# Patient Record
Sex: Male | Born: 1975 | Race: Asian | Hispanic: No | Marital: Married | Smoking: Never smoker
Health system: Southern US, Community
[De-identification: ages and names within clinical notes are randomized; demographics above are authoritative.]

---

## 2016-06-17 ENCOUNTER — Other Ambulatory Visit: Payer: Self-pay | Admitting: Infectious Disease

## 2016-06-17 ENCOUNTER — Ambulatory Visit
Admission: RE | Admit: 2016-06-17 | Discharge: 2016-06-17 | Disposition: A | Discharge status: Home / Self Care | Payer: No Typology Code available for payment source | Source: Ambulatory Visit | Attending: Infectious Disease | Admitting: Infectious Disease

## 2016-06-17 DIAGNOSIS — Z111 Encounter for screening for respiratory tuberculosis: Secondary | ICD-10-CM

## 2018-09-08 ENCOUNTER — Other Ambulatory Visit: Payer: Self-pay

## 2018-09-08 ENCOUNTER — Encounter (HOSPITAL_COMMUNITY): Payer: Self-pay | Admitting: Emergency Medicine

## 2018-09-08 ENCOUNTER — Emergency Department (HOSPITAL_COMMUNITY)
Admission: EM | Admit: 2018-09-08 | Discharge: 2018-09-09 | Disposition: A | Discharge status: Home / Self Care | Payer: Self-pay | Attending: Emergency Medicine | Admitting: Emergency Medicine

## 2018-09-08 DIAGNOSIS — S301XXA Contusion of abdominal wall, initial encounter: Secondary | ICD-10-CM | POA: Insufficient documentation

## 2018-09-08 DIAGNOSIS — Y929 Unspecified place or not applicable: Secondary | ICD-10-CM | POA: Insufficient documentation

## 2018-09-08 DIAGNOSIS — Y939 Activity, unspecified: Secondary | ICD-10-CM | POA: Insufficient documentation

## 2018-09-08 DIAGNOSIS — S20219A Contusion of unspecified front wall of thorax, initial encounter: Secondary | ICD-10-CM | POA: Insufficient documentation

## 2018-09-08 DIAGNOSIS — N130 Hydronephrosis with ureteropelvic junction obstruction: Secondary | ICD-10-CM | POA: Insufficient documentation

## 2018-09-08 DIAGNOSIS — Y999 Unspecified external cause status: Secondary | ICD-10-CM | POA: Insufficient documentation

## 2018-09-08 DIAGNOSIS — N200 Calculus of kidney: Secondary | ICD-10-CM | POA: Insufficient documentation
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
## 2018-09-09 ENCOUNTER — Emergency Department (HOSPITAL_COMMUNITY): Payer: Self-pay

## 2018-09-09 ENCOUNTER — Encounter (HOSPITAL_COMMUNITY): Payer: Self-pay | Admitting: Radiology

## 2018-09-09 LAB — I-STAT CREATININE, ED: Creatinine, Ser: 1.1 mg/dL (ref 0.61–1.24)

## 2018-09-09 MED ORDER — IOHEXOL 300 MG/ML  SOLN
100.0000 mL | Freq: Once | INTRAMUSCULAR | Status: AC | PRN
  Administered 2018-09-09: 100 mL via INTRAVENOUS

## 2018-09-09 MED ORDER — IBUPROFEN 600 MG PO TABS: 600.0000 mg | ORAL_TABLET | Freq: Four times a day (QID) | ORAL | 0 refills | Status: DC | PRN

## 2018-09-09 MED ORDER — IBUPROFEN 400 MG PO TABS
600.0000 mg | ORAL_TABLET | Freq: Once | ORAL | Status: AC
  Administered 2018-09-09: 600 mg via ORAL
  Filled 2018-09-09: qty 1

## 2018-09-09 NOTE — ED Notes (Signed)
Patient verbalizes understanding of discharge instructions. Opportunity for questioning and answers were provided. Armband removed by staff, pt discharged from ED ambulatory.   
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
## 2018-09-09 NOTE — ED Notes (Signed)
Patient transported to CT 

## 2020-04-06 ENCOUNTER — Ambulatory Visit (HOSPITAL_COMMUNITY)
Admission: EM | Admit: 2020-04-06 | Discharge: 2020-04-06 | Disposition: A | Discharge status: Home / Self Care | Payer: 59 | Attending: Family Medicine | Admitting: Family Medicine

## 2020-04-06 ENCOUNTER — Encounter (HOSPITAL_COMMUNITY): Payer: Self-pay

## 2020-04-06 ENCOUNTER — Other Ambulatory Visit: Payer: Self-pay | Admitting: Family Medicine

## 2020-04-06 ENCOUNTER — Other Ambulatory Visit: Payer: Self-pay

## 2020-04-06 DIAGNOSIS — M2550 Pain in unspecified joint: Secondary | ICD-10-CM

## 2020-04-06 DIAGNOSIS — R062 Wheezing: Secondary | ICD-10-CM | POA: Diagnosis not present

## 2020-04-06 DIAGNOSIS — R030 Elevated blood-pressure reading, without diagnosis of hypertension: Secondary | ICD-10-CM

## 2020-04-06 MED ORDER — ALBUTEROL SULFATE HFA 108 (90 BASE) MCG/ACT IN AERS: 1.0000 | INHALATION_SPRAY | Freq: Four times a day (QID) | RESPIRATORY_TRACT | 0 refills | Status: DC | PRN

## 2020-04-06 MED ORDER — PREDNISONE 10 MG PO TABS: ORAL_TABLET | ORAL | 0 refills | Status: DC

## 2020-04-06 NOTE — ED Provider Notes (Signed)
[facility]    CSN: [id number] Arrival date & time: 04/06/20  1026      History   Chief Complaint Chief Complaint  Patient presents with  . Muscle Pain    HPI Ronald Maxwell is a 44 y.o. male.   Patient declines medical interpreter today. Presenting for flare up of joint pains which he states seems to happen about once a year with the weather turning cold. He states each year he get put on prednisone taper which gets things back under control and otherwise just takes ibuprofen as needed for it. Denies joint redness, swelling, new injury. Also notes episodic wheezing also with the fall weather change. Does not have any inhalers at home, denies any pulmonary diagnoses. Denies CP, SOB, fever, chills. No sick contacts or recent illnesses.      History reviewed. No pertinent past medical history.  There are no problems to display for this patient.   History reviewed. No pertinent surgical history.     Home Medications    Prior to Admission medications   Medication Sig Start Date End Date Taking? Authorizing Provider  albuterol (VENTOLIN HFA) 108 (90 Base) MCG/ACT inhaler Inhale 1-2 puffs into the lungs every 6 (six) hours as needed for wheezing or shortness of breath. 04/06/20   Particia Nearing, PA-C  ibuprofen (ADVIL) 600 MG tablet Take 1 tablet (600 mg total) by mouth every 6 (six) hours as needed. 09/09/18   Elpidio Anis, PA-C  predniSONE (DELTASONE) 10 MG tablet Take 6 tabs day one, 5 tabs day two, 4 tabs day three, etc 04/06/20   Particia Nearing, PA-C    Family History No family history on file.  Social History Social History   Tobacco Use  . Smoking status: Never Smoker  . Smokeless tobacco: Never Used  Substance Use Topics  . Alcohol use: Yes  . Drug use: Never     Allergies   Patient has no known allergies.   Review of Systems Review of Systems PER HPI    Physical Exam Triage Vital Signs ED Triage Vitals  Enc Vitals  Group     BP 04/06/20 1103 (!) 144/103     Pulse Rate 04/06/20 1100 81     Resp 04/06/20 1100 18     Temp 04/06/20 1100 98 F (36.7 C)     Temp Source 04/06/20 1103 Oral     SpO2 04/06/20 1100 99 %     Weight 04/06/20 1104 165 lb 5.5 oz (75 kg)     Height --      Head Circumference --      Peak Flow --      Pain Score 04/06/20 1102 4     Pain Loc --      Pain Edu? --      Excl. in GC? --    No data found.  Updated Vital Signs BP (!) 144/103 (BP Location: Right Arm)   Pulse 83   Temp 98.1 F (36.7 C) (Oral)   Resp 18   Wt 165 lb 5.5 oz (75 kg)   SpO2 100%   BMI 27.51 kg/m   Visual Acuity Right Eye Distance:   Left Eye Distance:   Bilateral Distance:    Right Eye Near:   Left Eye Near:    Bilateral Near:     Physical Exam Vitals and nursing note reviewed.  Constitutional:      Appearance: Normal appearance.  HENT:     Head: Atraumatic.  Right Ear: Tympanic membrane normal.     Left Ear: Tympanic membrane normal.     Nose: Nose normal.     Mouth/Throat:     Mouth: Mucous membranes are moist.     Pharynx: Oropharynx is clear. No posterior oropharyngeal erythema.  Eyes:     Extraocular Movements: Extraocular movements intact.     Conjunctiva/sclera: Conjunctivae normal.  Cardiovascular:     Rate and Rhythm: Normal rate and regular rhythm.  Pulmonary:     Effort: Pulmonary effort is normal.     Breath sounds: Wheezing (diffuse, moderate) present.  Musculoskeletal:        General: No swelling, tenderness, deformity or signs of injury. Normal range of motion.     Cervical back: Normal range of motion and neck supple.  Skin:    General: Skin is warm and dry.     Findings: No bruising or erythema.  Neurological:     General: No focal deficit present.     Mental Status: He is oriented to person, place, and time.  Psychiatric:        Mood and Affect: Mood normal.        Thought Content: Thought content normal.        Judgment: Judgment normal.      UC  Treatments / Results  Labs (all labs ordered are listed, but only abnormal results are displayed) Labs Reviewed - No data to display  EKG   Radiology No results found.  Procedures Procedures (including critical care time)  Medications Ordered in UC Medications - No data to display  Initial Impression / Assessment and Plan / UC Course  I have reviewed the triage vital signs and the nursing notes.  Pertinent labs & imaging results that were available during my care of the patient were reviewed by me and considered in my medical decision making (see chart for details).     Will tx with prednisone taper, albuterol inhaler and ibuprofen for prn use for joint pains. Discussed need for PCP to follow up and manage preventative and chronic conditions. DASH diet, exercise for BP control. Return precautions given. Vital signs reassuring today aside from mildly elevated BP.   Final Clinical Impressions(s) / UC Diagnoses   Final diagnoses:  Arthralgia, unspecified joint  Wheezing  Elevated blood pressure reading   Discharge Instructions   None    ED Prescriptions    Medication Sig Dispense Auth. Provider   predniSONE (DELTASONE) 10 MG tablet Take 6 tabs day one, 5 tabs day two, 4 tabs day three, etc 21 tablet Particia Nearing, PA-C   albuterol (VENTOLIN HFA) 108 (90 Base) MCG/ACT inhaler Inhale 1-2 puffs into the lungs every 6 (six) hours as needed for wheezing or shortness of breath. 18 g Particia Nearing, [place]     PDMP not reviewed this encounter.   Particia Nearing, [place] 04/06/20 1156

## 2020-04-06 NOTE — ED Triage Notes (Signed)
Pt states he is out of his meds for his muscle pain.

## 2020-04-25 ENCOUNTER — Ambulatory Visit (HOSPITAL_COMMUNITY)
Admission: EM | Admit: 2020-04-25 | Discharge: 2020-04-25 | Disposition: A | Discharge status: Home / Self Care | Payer: 59 | Attending: Family Medicine | Admitting: Family Medicine

## 2020-04-25 ENCOUNTER — Other Ambulatory Visit: Payer: Self-pay

## 2020-04-25 ENCOUNTER — Encounter (HOSPITAL_COMMUNITY): Payer: Self-pay

## 2020-04-25 ENCOUNTER — Ambulatory Visit (HOSPITAL_COMMUNITY)
Admission: EM | Admit: 2020-04-25 | Discharge: 2020-04-25 | Disposition: A | Discharge status: Home / Self Care | Payer: 59

## 2020-04-25 DIAGNOSIS — R030 Elevated blood-pressure reading, without diagnosis of hypertension: Secondary | ICD-10-CM

## 2020-04-25 DIAGNOSIS — M2550 Pain in unspecified joint: Secondary | ICD-10-CM

## 2020-04-25 MED ORDER — PREDNISONE 10 MG (48) PO TBPK: ORAL_TABLET | ORAL | 0 refills | Status: DC

## 2020-04-25 NOTE — Discharge Instructions (Addendum)
Your blood pressure was noted to be elevated during your visit today. If you are currently taking medication for high blood pressure, please ensure you are taking this as directed. If you do not have a history of high blood pressure and your blood pressure remains persistently elevated, you may need to begin taking a medication at some point. You may return here within the next few days to recheck if unable to see your primary care provider or if do not have a one.  BP (!) 157/107 (BP Location: Right Arm)   Pulse 87   Temp 97.9 F (36.6 C) (Oral)   Resp 18   SpO2 99%

## 2020-04-25 NOTE — ED Triage Notes (Signed)
Pt reports needing a refill no Prednisone. Pt states he is still in pain.

## 2020-04-26 NOTE — ED Provider Notes (Signed)
[facility]   [id number] 04/25/20 Arrival Time: 1556  ASSESSMENT & PLAN:  1. Arthralgia of multiple joints   2. Elevated blood pressure reading without diagnosis of hypertension     Discussed that he will need to find a PCP to discuss long-term steroid use for joint problems.  Meds ordered this encounter  Medications   predniSONE (STERAPRED UNI-PAK 48 TAB) 10 MG (48) TBPK tablet    Sig: Take as directed.    Dispense:  48 tablet    Refill:  0     Discharge Instructions     Your blood pressure was noted to be elevated during your visit today. If you are currently taking medication for high blood pressure, please ensure you are taking this as directed. If you do not have a history of high blood pressure and your blood pressure remains persistently elevated, you may need to begin taking a medication at some point. You may return here within the next few days to recheck if unable to see your primary care provider or if do not have a one.  BP (!) 157/107 (BP Location: Right Arm)    Pulse 87    Temp 97.9 F (36.6 C) (Oral)    Resp 18    SpO2 99%        Follow-up Information    Schedule an appointment as soon as possible for a visit  with [facility].   Contact information: [address] [place] [address] [phone]       Schedule an appointment as soon as possible for a visit  with [facility].   Contact information: [address] [place] [address] [phone]              Reviewed expectations re: course of current medical issues. Questions answered. Outlined signs and symptoms indicating need for more acute intervention. Understanding verbalized. After Visit Summary given.   SUBJECTIVE: History from: patient. Ronald Maxwell is a 44 y.o. male who presents with "wintertime joint pains"; has been on prednisone for 3 months during the winter in the [place]. Otherwise feeling well. Describes generalized joint aches daily.  Increased blood pressure noted today. Reports that he has not been treated for hypertension in the past. He reports no chest pain on exertion, no dyspnea on exertion, no swelling of ankles, no orthostatic dizziness or lightheadedness, no orthopnea or paroxysmal nocturnal dyspnea and no palpitations.   OBJECTIVE:  Vitals:   04/25/20 1803  BP: (!) 157/107  Pulse: 87  Resp: 18  Temp: 97.9 F (36.6 C)  TempSrc: Oral  SpO2: 99%    General appearance: alert; no distress Eyes: PERRLA; EOMI; conjunctiva normal Neck: supple  Lungs: speaks full sentences without difficulty; unlabored Extremities: no edema; moves all extremities normally Skin: warm and dry Neurologic: normal gait Psychological: alert and cooperative; normal mood and affect   No Known Allergies  History reviewed. No pertinent past medical history. Social History   Socioeconomic History   Marital status: Married    Spouse name: Not on file   Number of children: Not on file   Years of education: Not on file   Highest education level: Not on file  Occupational History   Not on file  Tobacco Use   Smoking status: Never Smoker   Smokeless tobacco: Never Used  Substance and Sexual Activity   Alcohol use: Yes   Drug use: Never  Sexual activity: Not on file  Other Topics Concern   Not on file  Social History Narrative   Not on file   Social Determinants of Health   Financial Resource Strain: Not on file  Food Insecurity: Not on file  Transportation Needs: Not on file  Physical Activity: Not on file  Stress: Not on file  Social Connections: Not on file  Intimate Partner Violence: Not on file   History reviewed. No pertinent family history. History reviewed. No pertinent surgical history.   Mardella Layman, MD 04/26/20 [phone]

## 2020-04-28 ENCOUNTER — Other Ambulatory Visit: Payer: Self-pay | Admitting: Family Medicine

## 2020-04-30 NOTE — Telephone Encounter (Signed)
Requested Prescriptions  Pending Prescriptions Disp Refills  . albuterol (VENTOLIN HFA) 108 (90 Base) MCG/ACT inhaler [Pharmacy Med Name: ALBUTEROL HFA INH(200 PUFFS)18GM] 54 g 0    Sig: INHALE 1 TO 2 PUFFS INTO THE LUNGS EVERY 6 HOURS AS NEEDED FOR WHEEZING OR SHORTNESS OF BREATH     Pulmonology:  Beta Agonists Failed - 04/28/2020  8:47 AM      Failed - One inhaler should last at least one month. If the patient is requesting refills earlier, contact the patient to check for uncontrolled symptoms.      Failed - Valid encounter within last 12 months    Recent Outpatient Visits   None

## 2020-07-16 NOTE — Progress Notes (Deleted)
   Primary Physician/Referring:  Lindaann Pascal, PA-C  Patient ID: Ronald Maxwell, male    DOB: 19-Sep-1975, 45 y.o.   MRN: [id number]  No chief complaint on file.  HPI:    Ronald Maxwell  is a 45 y.o. ***  No past medical history on file. No past surgical history on file. No family history on file.  Social History   Tobacco Use  . Smoking status: Never Smoker  . Smokeless tobacco: Never Used  Substance Use Topics  . Alcohol use: Yes   Marital Status: Married  ROS  ***ROS Objective  There were no vitals taken for this visit.  Vitals with BMI 04/25/2020 04/06/2020 04/06/2020  Height - - -  Weight - 165 lbs 6 oz -  BMI - - -  Systolic 157 - 144  Diastolic 426 - 103  Pulse 87 - 83     ***Physical Exam Laboratory examination:   No results for input(s): NA, K, CL, CO2, GLUCOSE, BUN, CREATININE, CALCIUM, GFRNONAA, GFRAA in the last 8760 hours. CrCl cannot be calculated (Patient's most recent lab result is older than the maximum 21 days allowed.).  CMP Latest Ref Rng & Units 09/09/2018  Creatinine 0.61 - 1.24 mg/dL 8.34   No flowsheet data found.  Lipid Panel No results for input(s): CHOL, TRIG, LDLCALC, VLDL, HDL, CHOLHDL, LDLDIRECT in the last 8760 hours. Lipid Panel  No results found for: CHOL, TRIG, HDL, CHOLHDL, VLDL, LDLCALC, LDLDIRECT, LABVLDL   HEMOGLOBIN A1C No results found for: HGBA1C, MPG TSH No results for input(s): TSH in the last 8760 hours.  External labs:   Labs 06/23/2020:  Total cholesterol 141, glycerides 88, HDL 51, LDL 73.  Hepatitis AB and C panel negative.  Serum glucose 63 mg, BUN 13, creatinine 0.88, magnesium 2.1, ALT elevated at 90 (9-46), AST 41 (10-40).  Sedimentation rate and ANA normal.  Rheumatoid factor negative at <14.  C-reactive protein markedly elevated at 37.1.  TSH normal.  Total CPK 145.  Hb 16.8/HCT 51.3, WBC 8.4, platelets 196, normal indicis.  Medications and allergies  No Known Allergies   Outpatient  Medications Prior to Visit  Medication Sig  . albuterol (VENTOLIN HFA) 108 (90 Base) MCG/ACT inhaler INHALE 1 TO 2 PUFFS INTO THE LUNGS EVERY 6 HOURS AS NEEDED FOR WHEEZING OR SHORTNESS OF BREATH  . ibuprofen (ADVIL) 600 MG tablet Take 1 tablet (600 mg total) by mouth every 6 (six) hours as needed.  . predniSONE (STERAPRED UNI-PAK 48 TAB) 10 MG (48) TBPK tablet Take as directed.   No facility-administered medications prior to visit.    Radiology:   No results found.   Cardiac Studies:   *** EKG:     ***  ***  Assessment  No diagnosis found.   There are no discontinued medications.  No orders of the defined types were placed in this encounter.  No orders of the defined types were placed in this encounter.   Recommendations:   Ronald Maxwell is a 45 y.o. ***    Yates Decamp, MD, [facility] 07/16/2020, 10:01 PM Office: [phone]

## 2020-07-17 ENCOUNTER — Ambulatory Visit: Payer: 59 | Admitting: Cardiology

## 2020-07-17 DIAGNOSIS — I10 Essential (primary) hypertension: Secondary | ICD-10-CM

## 2020-07-17 DIAGNOSIS — R945 Abnormal results of liver function studies: Secondary | ICD-10-CM

## 2020-07-17 DIAGNOSIS — I480 Paroxysmal atrial fibrillation: Secondary | ICD-10-CM

## 2020-09-04 ENCOUNTER — Encounter: Payer: Self-pay | Admitting: Cardiology

## 2020-10-11 ENCOUNTER — Ambulatory Visit (HOSPITAL_COMMUNITY)
Admission: EM | Admit: 2020-10-11 | Discharge: 2020-10-11 | Disposition: A | Discharge status: Home / Self Care | Payer: 59 | Attending: Urgent Care | Admitting: Urgent Care

## 2020-10-11 ENCOUNTER — Encounter (HOSPITAL_COMMUNITY): Payer: Self-pay | Admitting: Emergency Medicine

## 2020-10-11 ENCOUNTER — Other Ambulatory Visit: Payer: Self-pay

## 2020-10-11 DIAGNOSIS — M79622 Pain in left upper arm: Secondary | ICD-10-CM

## 2020-10-11 DIAGNOSIS — M25512 Pain in left shoulder: Secondary | ICD-10-CM

## 2020-10-11 DIAGNOSIS — M25511 Pain in right shoulder: Secondary | ICD-10-CM

## 2020-10-11 DIAGNOSIS — M791 Myalgia, unspecified site: Secondary | ICD-10-CM | POA: Diagnosis not present

## 2020-10-11 DIAGNOSIS — M79621 Pain in right upper arm: Secondary | ICD-10-CM

## 2020-10-11 MED ORDER — TIZANIDINE HCL 4 MG PO TABS: 4.0000 mg | ORAL_TABLET | Freq: Three times a day (TID) | ORAL | 0 refills | Status: DC | PRN

## 2020-10-11 MED ORDER — NAPROXEN 500 MG PO TABS: 500.0000 mg | ORAL_TABLET | Freq: Two times a day (BID) | ORAL | 0 refills | Status: DC

## 2020-10-11 NOTE — ED Triage Notes (Signed)
Pt is present with joint and muscle pain. Pt states that his pain is primarily in his arms and joints. Pt states that his pain started few days ago

## 2020-10-11 NOTE — ED Provider Notes (Signed)
[facility]   MRN: [id number] DOB: December 10, 1975  Subjective:   Ronald Maxwell is a 45 y.o. male presenting for several day history of acute onset recurrent upper arm pain, upper back pain.  Patient states that he would like to get another prescription for steroids.  Reports that he has gotten these intermittently for muscle aches since he was in high school.  States that he has had multiple work-ups done and everything has been negative.  Patient is used to having 1-2 courses of steroids every year to feel better from his aches.  Denies any falls, trauma, weakness, numbness or tingling, history of musculoskeletal disorders, rheumatoid.  No swelling, erythema, fevers, nausea, vomiting.  Patient has not been particularly active in the past week.  He does not have a PCP.  No current facility-administered medications for this encounter.  Current Outpatient Medications:    albuterol (VENTOLIN HFA) 108 (90 Base) MCG/ACT inhaler, INHALE 1 TO 2 PUFFS INTO THE LUNGS EVERY 6 HOURS AS NEEDED FOR WHEEZING OR SHORTNESS OF BREATH, Disp: 54 g, Rfl: 0   ibuprofen (ADVIL) 600 MG tablet, Take 1 tablet (600 mg total) by mouth every 6 (six) hours as needed., Disp: 30 tablet, Rfl: 0   predniSONE (STERAPRED UNI-PAK 48 TAB) 10 MG (48) TBPK tablet, Take as directed., Disp: 48 tablet, Rfl: 0   No Known Allergies  History reviewed. No pertinent past medical history.   History reviewed. No pertinent surgical history.  History reviewed. No pertinent family history.  Social History   Tobacco Use   Smoking status: Never   Smokeless tobacco: Never  Substance Use Topics   Alcohol use: Yes   Drug use: Never    ROS   Objective:   Vitals: BP (!) 148/96   Pulse 99   Temp 98.5 F (36.9 C)   Resp 18   SpO2 100%   Physical Exam Constitutional:      General: He is not in acute distress.    Appearance: Normal appearance. He is well-developed and normal weight. He is not ill-appearing,  toxic-appearing or diaphoretic.  HENT:     Head: Normocephalic and atraumatic.     Right Ear: External ear normal.     Left Ear: External ear normal.     Nose: Nose normal.     Mouth/Throat:     Pharynx: Oropharynx is clear.  Eyes:     General: No scleral icterus.       Right eye: No discharge.        Left eye: No discharge.     Extraocular Movements: Extraocular movements intact.     Pupils: Pupils are equal, round, and reactive to light.  Cardiovascular:     Rate and Rhythm: Normal rate.  Pulmonary:     Effort: Pulmonary effort is normal.  Musculoskeletal:     Cervical back: Normal range of motion.     Comments: Full range of motion throughout.  Strength 5/5 for upper and lower extremities.  No swelling, erythema, warmth.  Patient endorses tenderness across his upper extremities and trapezius muscle, mild in nature.  Neurological:     Mental Status: He is alert and oriented to person, place, and time.  Psychiatric:        Mood and Affect: Mood normal.        Behavior: Behavior normal.        Thought Content: Thought content normal.        Judgment: Judgment normal.  Assessment and Plan :   PDMP not reviewed this encounter.  1. Myalgia   2. Pain of both shoulder joints   3. Pain in both upper arms     Patient requested a repeat steroid course but I refused.  Counseled against using steroids without a medically appropriate reason.  Discussed the risks of long-term use of steroids.  Recommended naproxen, tizanidine.  Patient refused work-up. Counseled patient on potential for adverse effects with medications prescribed/recommended today, ER and return-to-clinic precautions discussed, patient verbalized understanding.    Wallis Bamberg, PA-C 10/11/20 1435

## 2020-10-16 NOTE — Progress Notes (Deleted)
Date:  10/16/2020   ID:  Unk Pinto, DOB 1975-11-21, MRN [id number]  PCP:  Shanon Rosser, PA-C  Cardiologist:  Rex Kras, DO, [facility]  (established care 10/17/2020) Former Cardiology Providers: ***  REASON FOR CONSULT: Atrial fibrillation  REQUESTING PHYSICIAN:  Shanon Rosser, PA-C [address],  [place] [address]  No chief complaint on file.   HPI  Ronald Maxwell is a 45 y.o. male who presents to the office with a chief complaint of "***." Patient's past medical history and cardiovascular risk factors include: ***  He is referred to the office at the request of [place], PA-C for evaluation of atrial fibrillation.   History of  Denies prior history of coronary artery disease, myocardial infarction, congestive heart failure, deep venous thrombosis, pulmonary embolism, stroke, transient ischemic attack.  FUNCTIONAL STATUS:   ALLERGIES: No Known Allergies  MEDICATION LIST PRIOR TO VISIT: No outpatient medications have been marked as taking for the 10/17/20 encounter (Appointment) with Rex Kras, DO.     PAST MEDICAL HISTORY: No past medical history on file.  PAST SURGICAL HISTORY: No past surgical history on file.  FAMILY HISTORY: The patient family history is not on file.  SOCIAL HISTORY:  The patient  reports that he has never smoked. He has never used smokeless tobacco. He reports current alcohol use. He reports that he does not use drugs.  REVIEW OF SYSTEMS: ROS  PHYSICAL EXAM: Vitals with BMI 10/11/2020 10/11/2020 04/25/2020  Height - - -  Weight - - -  BMI - - -  Systolic 681 157 262  Diastolic 96 035 597  Pulse - 99 87    CONSTITUTIONAL: Well-developed and well-nourished. No acute distress.  SKIN: Skin is warm and dry. No rash noted. No cyanosis. No pallor. No jaundice HEAD: Normocephalic and atraumatic.  EYES: No scleral icterus MOUTH/THROAT: Moist oral membranes.  NECK: No JVD present. No thyromegaly noted. No carotid  bruits  LYMPHATIC: No visible cervical adenopathy.  CHEST Normal respiratory effort. No intercostal retractions  LUNGS: *** No stridor. No wheezes. No rales.  CARDIOVASCULAR: *** ABDOMINAL: No apparent ascites.  EXTREMITIES: No peripheral edema  HEMATOLOGIC: No significant bruising NEUROLOGIC: Oriented to person, place, and time. Nonfocal. Normal muscle tone.  PSYCHIATRIC: Normal mood and affect. Normal behavior. Cooperative  CARDIAC DATABASE: EKG: 09/26/2020:  Echocardiogram: No results found for this or any previous visit from the past 1095 days.    Stress Testing: No results found for this or any previous visit from the past 1095 days.   Heart Catheterization:   LABORATORY DATA: No flowsheet data found.  CMP Latest Ref Rng & Units 09/09/2018  Creatinine 0.61 - 1.24 mg/dL 1.10    Lipid Panel  No results found for: CHOL, TRIG, HDL, CHOLHDL, VLDL, LDLCALC, LDLDIRECT, LABVLDL  No components found for: NTPROBNP No results for input(s): PROBNP in the last 8760 hours. No results for input(s): TSH in the last 8760 hours.  BMP No results for input(s): NA, K, CL, CO2, GLUCOSE, BUN, CREATININE, CALCIUM, GFRNONAA, GFRAA in the last 8760 hours.  HEMOGLOBIN A1C No results found for: HGBA1C, MPG  External Labs:  Date Collected: 06/27/2020, information obtained by *** Potassium: 4.2 Creatinine 0.88 mg/dL. eGFR: 103 mL/min per 1.73 m Lipid profile: Total cholesterol 141 , triglycerides 88 , HDL 51 , LDL 73 AST: 41 , ALT: 90 , alkaline phosphatase: 93  Hemoglobin A1c: ***  Date Collected: 06/12/2020 Hemoglobin: 16.8 g/dL and hematocrit: 51.3 %  Date Collected: 06/23/2020  TSH: 1.57    IMPRESSION:  No diagnosis found.   RECOMMENDATIONS: Ronald Maxwell is a 45 y.o. male whose past medical history and cardiac risk factors include: ***   FINAL MEDICATION LIST END OF ENCOUNTER: No orders of the defined types were placed in this encounter.   There are no  discontinued medications.   Current Outpatient Medications:    albuterol (VENTOLIN HFA) 108 (90 Base) MCG/ACT inhaler, INHALE 1 TO 2 PUFFS INTO THE LUNGS EVERY 6 HOURS AS NEEDED FOR WHEEZING OR SHORTNESS OF BREATH, Disp: 54 g, Rfl: 0   ibuprofen (ADVIL) 600 MG tablet, Take 1 tablet (600 mg total) by mouth every 6 (six) hours as needed., Disp: 30 tablet, Rfl: 0   naproxen (NAPROSYN) 500 MG tablet, Take 1 tablet (500 mg total) by mouth 2 (two) times daily with a meal., Disp: 30 tablet, Rfl: 0   predniSONE (STERAPRED UNI-PAK 48 TAB) 10 MG (48) TBPK tablet, Take as directed., Disp: 48 tablet, Rfl: 0   tiZANidine (ZANAFLEX) 4 MG tablet, Take 1 tablet (4 mg total) by mouth every 8 (eight) hours as needed., Disp: 30 tablet, Rfl: 0  No orders of the defined types were placed in this encounter.   There are no Patient Instructions on file for this visit.   --Continue cardiac medications as reconciled in final medication list. --No follow-ups on file. Or sooner if needed. --Continue follow-up with your primary care physician regarding the management of your other chronic comorbid conditions.  Patient's questions and concerns were addressed to his satisfaction. He voices understanding of the instructions provided during this encounter.   This note was created using a voice recognition software as a result there may be grammatical errors inadvertently enclosed that do not reflect the nature of this encounter. Every attempt is made to correct such errors.  Rex Kras, [place], [facility]  Pager: [phone] Office: [phone]

## 2020-10-17 ENCOUNTER — Ambulatory Visit: Payer: 59 | Admitting: Cardiology

## 2020-11-01 ENCOUNTER — Other Ambulatory Visit: Payer: Self-pay

## 2020-11-01 ENCOUNTER — Encounter (HOSPITAL_COMMUNITY): Payer: Self-pay | Admitting: *Deleted

## 2020-11-01 ENCOUNTER — Ambulatory Visit (HOSPITAL_COMMUNITY)
Admission: EM | Admit: 2020-11-01 | Discharge: 2020-11-01 | Disposition: A | Discharge status: Home / Self Care | Payer: 59 | Attending: Student | Admitting: Student

## 2020-11-01 DIAGNOSIS — I4819 Other persistent atrial fibrillation: Secondary | ICD-10-CM | POA: Diagnosis not present

## 2020-11-01 DIAGNOSIS — R0602 Shortness of breath: Secondary | ICD-10-CM

## 2020-11-01 NOTE — ED Triage Notes (Signed)
PT reports SX's started 2 days ago . RT/LT arm pain with SHOB and CP.

## 2020-11-01 NOTE — ED Notes (Signed)
Provider given EKG results

## 2020-11-01 NOTE — ED Triage Notes (Signed)
PT denies CP and refuses EKG. PA notified

## 2020-11-01 NOTE — Discharge Instructions (Addendum)
-  Please head straight to the emergency department for further evaluation and management of your shortness of breath and left arm pain.  In the context of atrial fibrillation, this is a very concerning symptom.  A. fib puts you at risk of blood clots, and other cardiac issues like a heart attack, which can be deadly.  Please head straight to the emergency department, if your symptoms get worse on the way, stop and call [phone].

## 2020-11-01 NOTE — ED Notes (Signed)
Patient is being discharged from the Urgent Care and sent to the Emergency Department via personal car . Per Amedeo Plenty, patient is in need of higher level of care due to condition. Patient is aware and verbalizes understanding of plan of care.  Vitals:   11/01/20 1654 11/01/20 1657  BP: (!) 149/107   Pulse: 78   Resp: 18   Temp: 98.4 F (36.9 C)   SpO2: 98% 98%

## 2020-11-01 NOTE — ED Provider Notes (Addendum)
[facility]    CSN: [id number] Arrival date & time: 11/01/20  1604      History   Chief Complaint Chief Complaint  Patient presents with   Shortness of Breath   arm pain LT/RT    HPI Ronald Maxwell is a 45 y.o. male presenting with shortness of breath and left arm pain.  This patient has a history of atrial fibrillation but is not anticoagulated.  He initially endorses chest pain, but denies this upon further questioning.  States he is here for prednisone, which is helped in the past.  He denies history of pulmonary disease however.  Denies recent URI, cough, congestion.  Denies dizziness, weakness.  HPI  History reviewed. No pertinent past medical history.  There are no problems to display for this patient.   History reviewed. No pertinent surgical history.     Home Medications    Prior to Admission medications   Medication Sig Start Date End Date Taking? Authorizing Provider  albuterol (VENTOLIN HFA) 108 (90 Base) MCG/ACT inhaler INHALE 1 TO 2 PUFFS INTO THE LUNGS EVERY 6 HOURS AS NEEDED FOR WHEEZING OR SHORTNESS OF BREATH 04/30/20   Margaretann Loveless, PA-C  ibuprofen (ADVIL) 600 MG tablet Take 1 tablet (600 mg total) by mouth every 6 (six) hours as needed. 09/09/18   Elpidio Anis, PA-C  naproxen (NAPROSYN) 500 MG tablet Take 1 tablet (500 mg total) by mouth 2 (two) times daily with a meal. 10/11/20   Wallis Bamberg, PA-C  predniSONE (STERAPRED UNI-PAK 48 TAB) 10 MG (48) TBPK tablet Take as directed. 04/25/20   Mardella Layman, MD  tiZANidine (ZANAFLEX) 4 MG tablet Take 1 tablet (4 mg total) by mouth every 8 (eight) hours as needed. 10/11/20   Wallis Bamberg, PA-C    Family History History reviewed. No pertinent family history.  Social History Social History   Tobacco Use   Smoking status: Never   Smokeless tobacco: Never  Substance Use Topics   Alcohol use: Yes   Drug use: Never     Allergies   Patient has no known allergies.   Review of  Systems Review of Systems  Respiratory:  Positive for shortness of breath.   Cardiovascular:  Positive for chest pain.  All other systems reviewed and are negative.   Physical Exam Triage Vital Signs ED Triage Vitals  Enc Vitals Group     BP 11/01/20 1654 (!) 149/107     Pulse Rate 11/01/20 1654 78     Resp 11/01/20 1654 18     Temp 11/01/20 1654 98.4 F (36.9 C)     Temp src --      SpO2 11/01/20 1654 98 %     Weight --      Height --      Head Circumference --      Peak Flow --      Pain Score 11/01/20 1657 4     Pain Loc --      Pain Edu? --      Excl. in GC? --    No data found.  Updated Vital Signs BP (!) 149/107   Pulse 78   Temp 98.4 F (36.9 C)   Resp 18   SpO2 98%   Visual Acuity Right Eye Distance:   Left Eye Distance:   Bilateral Distance:    Right Eye Near:   Left Eye Near:    Bilateral Near:     Physical Exam Vitals reviewed.  Constitutional:  Appearance: Normal appearance. He is not diaphoretic.  HENT:     Head: Normocephalic and atraumatic.     Mouth/Throat:     Mouth: Mucous membranes are moist.  Eyes:     Extraocular Movements: Extraocular movements intact.     Pupils: Pupils are equal, round, and reactive to light.  Cardiovascular:     Rate and Rhythm: Normal rate.     Pulses:          Radial pulses are 2+ on the right side and 2+ on the left side.     Heart sounds: Normal heart sounds.     Comments: Radial pulses irregularly regular Pulmonary:     Effort: Pulmonary effort is normal.     Breath sounds: Wheezing present.     Comments: Expiratory wheezes throughout Abdominal:     Palpations: Abdomen is soft.     Tenderness: There is no abdominal tenderness. There is no guarding or rebound.  Musculoskeletal:     Right lower leg: No edema.     Left lower leg: No edema.  Skin:    General: Skin is warm.     Capillary Refill: Capillary refill takes less than 2 seconds.  Neurological:     General: No focal deficit present.      Mental Status: He is alert and oriented to person, place, and time.  Psychiatric:        Mood and Affect: Mood normal.        Behavior: Behavior normal.        Thought Content: Thought content normal.        Judgment: Judgment normal.     UC Treatments / Results  Labs (all labs ordered are listed, but only abnormal results are displayed) Labs Reviewed - No data to display  EKG   Radiology No results found.  Procedures Procedures (including critical care time)  Medications Ordered in UC Medications - No data to display  Initial Impression / Assessment and Plan / UC Course  I have reviewed the triage vital signs and the nursing notes.  Pertinent labs & imaging results that were available during my care of the patient were reviewed by me and considered in my medical decision making (see chart for details).     This patient is a very pleasant 45 y.o. year old male presenting with shortness of breath and left arm pain.  This patient has history of atrial fibrillation and is indeed in atrial fibrillation again today.  He is not anticoagulated.  Regular rate.  He does have expiratory wheezes throughout, but given his cardiac history I cannot discharge him home with prednisone without further cardiac work-up.  I recommend this patient head to the emergency department, he declines transport via EMS.  He verbalizes understanding  Coding this visit a Level 5 for - 1 acute illness that poses a threat to bodily function, ordering and interpretation of ekg, and decision to send to ED  Final Clinical Impressions(s) / UC Diagnoses   Final diagnoses:  Persistent atrial fibrillation (HCC)  Shortness of breath     Discharge Instructions      -Please head straight to the emergency department for further evaluation and management of your shortness of breath and left arm pain.  In the context of atrial fibrillation, this is a very concerning symptom.  A. fib puts you at risk of blood  clots, and other cardiac issues like a heart attack, which can be deadly.  Please head straight to the emergency  department, if your symptoms get worse on the way, stop and call 911.   ED Prescriptions   None    PDMP not reviewed this encounter.   Rhys Martini, PA-C 11/01/20 1841    Rhys Martini, PA-C 11/05/20 [phone]
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
## 2023-05-28 ENCOUNTER — Ambulatory Visit (INDEPENDENT_AMBULATORY_CARE_PROVIDER_SITE_OTHER): Payer: Medicaid Other

## 2023-05-28 ENCOUNTER — Encounter (HOSPITAL_COMMUNITY): Payer: Self-pay

## 2023-05-28 ENCOUNTER — Ambulatory Visit (HOSPITAL_COMMUNITY)
Admission: EM | Admit: 2023-05-28 | Discharge: 2023-05-28 | Disposition: A | Discharge status: Home / Self Care | Payer: Medicaid Other | Attending: Emergency Medicine | Admitting: Emergency Medicine

## 2023-05-28 ENCOUNTER — Other Ambulatory Visit: Payer: Self-pay

## 2023-05-28 DIAGNOSIS — R062 Wheezing: Secondary | ICD-10-CM | POA: Diagnosis not present

## 2023-05-28 DIAGNOSIS — R0602 Shortness of breath: Secondary | ICD-10-CM

## 2023-05-28 DIAGNOSIS — I4891 Unspecified atrial fibrillation: Secondary | ICD-10-CM | POA: Diagnosis not present

## 2023-05-28 MED ORDER — DEXAMETHASONE SODIUM PHOSPHATE 10 MG/ML IJ SOLN
INTRAMUSCULAR | Status: AC
  Filled 2023-05-28: qty 1

## 2023-05-28 MED ORDER — IPRATROPIUM-ALBUTEROL 0.5-2.5 (3) MG/3ML IN SOLN
RESPIRATORY_TRACT | Status: AC
  Filled 2023-05-28: qty 3

## 2023-05-28 MED ORDER — IPRATROPIUM-ALBUTEROL 0.5-2.5 (3) MG/3ML IN SOLN
3.0000 mL | Freq: Once | RESPIRATORY_TRACT | Status: AC
  Administered 2023-05-28: 3 mL via RESPIRATORY_TRACT

## 2023-05-28 MED ORDER — DEXAMETHASONE SODIUM PHOSPHATE 10 MG/ML IJ SOLN
10.0000 mg | Freq: Once | INTRAMUSCULAR | Status: AC
  Administered 2023-05-28: 10 mg via INTRAMUSCULAR

## 2023-05-28 MED ORDER — PREDNISONE 20 MG PO TABS: 40.0000 mg | ORAL_TABLET | Freq: Every day | ORAL | 0 refills | Status: AC

## 2023-05-28 MED ORDER — ALBUTEROL SULFATE HFA 108 (90 BASE) MCG/ACT IN AERS: 1.0000 | INHALATION_SPRAY | Freq: Four times a day (QID) | RESPIRATORY_TRACT | 0 refills | Status: AC | PRN

## 2023-05-28 NOTE — Discharge Instructions (Addendum)
??????? ?? ??? ????? ?????? ???? ???? ? ??????? ?????? ?????? ?????????? ???? ??????? ???????? ??????????? ???? ??????????  ??? ??????? ??????? ?? ??? ????? ?????? ???? ???? ?????? ????? ??? ???? ????? ??? ??? ??????? ???????? ??????? ????? ???? ???????????????? ????? ?? ???? ???? ????????? ?????? ????????? ? ???????? ?????? ?????????? ?????????? ????? ????? ??????    Use the inhaler every 6 hours for wheezing or shortness of breath.  Start the steroids tomorrow with breakfast.  Do not hesitate to follow-up with nearest emergency department if your wheezing or shortness of breath gets worse instead of better.  You are quite hypertensive today and would benefit from evaluation by a primary care provider.

## 2023-05-28 NOTE — ED Triage Notes (Signed)
Pt c/o SOB x1 hr with labored breathing. Speaking in complete sentence.

## 2023-05-28 NOTE — ED Provider Notes (Signed)
[facility]    CSN: [id number] Arrival date & time: 05/28/23  1731      History   Chief Complaint Chief Complaint  Patient presents with   Shortness of Breath    HPI Ronald Maxwell is a 48 y.o. male.   Patient presents to clinic complaining of shortness of breath that started about an hour prior to arrival.  He is able to speak in complete sentences.  Declined language interpreter.  He reports this has happened to him in the past and he has gotten better with steroids.  Denies any history of asthma.  He is not anticoagulated, does have a history of A-fib.  No chest pain.  Endorses shortness of breath and wheezing.  No fevers, body aches, congestion, sore throat or recent viral symptoms.  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   The history is provided by the patient and medical records.  Shortness of Breath   History reviewed. No pertinent past medical history.  There are no active problems to display for this patient.   History reviewed. No pertinent surgical history.     Home Medications    Prior to Admission medications   Medication Sig Start Date End Date Taking? Authorizing Provider  albuterol (VENTOLIN HFA) 108 (90 Base) MCG/ACT inhaler Inhale 1-2 puffs into the lungs every 6 (six) hours as needed for wheezing or shortness of breath. 05/28/23  Yes Rinaldo Ratel, [place] N, FNP  predniSONE (DELTASONE) 20 MG tablet Take 2 tablets (40 mg total) by mouth daily for 5 days. 05/28/23 06/02/23 Yes Ottilie Wigglesworth, [place] N, FNP    Family History History reviewed. No pertinent family history.  Social History Social History   Tobacco Use   Smoking status: Never   Smokeless tobacco: Never  Substance Use Topics   Alcohol use: Yes   Drug use: Never     Allergies   Patient has no known allergies.   Review of Systems Review of Systems  Per HPI   Physical Exam Triage Vital Signs ED Triage Vitals [05/28/23 1740]  Encounter  Vitals Group     BP (!) 201/150     Systolic BP Percentile      Diastolic BP Percentile      Pulse Rate 86     Resp (!) 30     Temp 98.3 F (36.8 C)     Temp Source Oral     SpO2 100 %     Weight      Height      Head Circumference      Peak Flow      Pain Score 0     Pain Loc      Pain Education      Exclude from Growth Chart    No data found.  Updated Vital Signs BP (!) 190/127 (BP Location: Left Arm)   Pulse 82   Temp 98.3 F (36.8 C) (Oral)   Resp 20   SpO2 97%   Visual Acuity Right Eye Distance:   Left Eye Distance:   Bilateral Distance:    Right Eye Near:   Left Eye Near:    Bilateral Near:     Physical Exam Vitals and nursing note reviewed.  Constitutional:      Appearance: He is ill-appearing.  HENT:     Head: Normocephalic and atraumatic.     Mouth/Throat:     Mouth: Mucous membranes are moist.  Cardiovascular:     Rate and Rhythm:  Normal rate and regular rhythm.  Pulmonary:     Effort: Tachypnea present.     Breath sounds: Decreased breath sounds, wheezing and rhonchi present.  Musculoskeletal:        General: Normal range of motion.     Cervical back: Normal range of motion.  Skin:    General: Skin is warm and dry.  Neurological:     General: No focal deficit present.     Mental Status: He is alert and oriented to person, place, and time.  Psychiatric:        Mood and Affect: Mood normal.        Behavior: Behavior normal.      UC Treatments / Results  Labs (all labs ordered are listed, but only abnormal results are displayed) Labs Reviewed - No data to display  EKG   Radiology No results found.  Procedures Procedures (including critical care time)  Medications Ordered in UC Medications  ipratropium-albuterol (DUONEB) 0.5-2.5 (3) MG/3ML nebulizer solution 3 mL (3 mLs Nebulization Given 05/28/23 1800)  ipratropium-albuterol (DUONEB) 0.5-2.5 (3) MG/3ML nebulizer solution 3 mL (3 mLs Nebulization Given 05/28/23 1837)   dexamethasone (DECADRON) injection 10 mg (10 mg Intramuscular Given 05/28/23 1837)    Initial Impression / Assessment and Plan / UC Course  I have reviewed the triage vital signs and the nursing notes.  Pertinent labs & imaging results that were available during my care of the patient were reviewed by me and considered in my medical decision making (see chart for details).  Vitals and triage reviewed, patient is initially tachypneic and air hungry in triage.  Oxygenation 100%.  Improved after DuoNeb, subjective improvement as well.  Better air movement.  EKG prior to Neb trt shows a-fib at a rate of 89, no STE or STD. No chest pain.   Chest x-ray shows a irregular density in the left lung, consistent with previous imaging.  Does have a history of positive PPD in the past.  Afebrile, without cough or bacterial symptoms.  Suspect reactive airway disease due to sudden shortness of breath, potentially asthma.  Given another DuoNeb and IM steroids in clinic.  Sent home on steroid burst and with albuterol inhaler.  Discussed concerned with hypertension, unclear if reactive or baseline.  Encouraged PCP follow-up.  Discussed he would benefit from CT scan and repeat imaging of the irregularity in his left lung.  Patient would like to defer emergency department evaluation at this time and agrees to head to the nearest ED if symptoms worsen.     Final Clinical Impressions(s) / UC Diagnoses   Final diagnoses:  Shortness of breath  Wheezing  Atrial fibrillation, unspecified type (HCC)     Discharge Instructions      ??????? ?? ??? ????? ?????? ???? ???? ? ??????? ?????? ?????? ?????????? ???? ??????? ???????? ??????????? ???? ??????????  ??? ??????? ??????? ?? ??? ????? ?????? ???? ???? ?????? ????? ??? ???? ????? ??? ??? ??????? ???????? ??????? ????? ???? ???????????????? ????? ?? ???? ???? ????????? ?????? ????????? ? ???????? ?????? ?????????? ?????????? ????? ????? ??????  Use the  inhaler every 6 hours for wheezing or shortness of breath.  Start the steroids tomorrow with breakfast.  Do not hesitate to follow-up with nearest emergency department if your wheezing or shortness of breath gets worse instead of better.  You are quite hypertensive today and would benefit from evaluation by a primary care provider.     ED Prescriptions     Medication Sig Dispense Auth. Provider  predniSONE (DELTASONE) 20 MG tablet Take 2 tablets (40 mg total) by mouth daily for 5 days. 10 tablet Rinaldo Ratel, [place] N, FNP   albuterol (VENTOLIN HFA) 108 (90 Base) MCG/ACT inhaler Inhale 1-2 puffs into the lungs every 6 (six) hours as needed for wheezing or shortness of breath. 18 g Jaleea Alesi, [place] N, [place]      PDMP not reviewed this encounter.   Malayah Demuro, [place] N, [place] 05/28/23 [phone]

## 2024-03-07 ENCOUNTER — Other Ambulatory Visit: Payer: Self-pay

## 2024-03-07 ENCOUNTER — Encounter (HOSPITAL_COMMUNITY): Payer: Self-pay | Admitting: *Deleted

## 2024-03-07 ENCOUNTER — Emergency Department (HOSPITAL_COMMUNITY)
Admission: EM | Admit: 2024-03-07 | Discharge: 2024-03-08 | Disposition: A | Payer: Self-pay | Attending: Emergency Medicine | Admitting: Emergency Medicine

## 2024-03-07 ENCOUNTER — Emergency Department (HOSPITAL_COMMUNITY): Payer: Self-pay

## 2024-03-07 DIAGNOSIS — R1032 Left lower quadrant pain: Secondary | ICD-10-CM | POA: Insufficient documentation

## 2024-03-07 DIAGNOSIS — D72829 Elevated white blood cell count, unspecified: Secondary | ICD-10-CM | POA: Insufficient documentation

## 2024-03-07 DIAGNOSIS — Z79899 Other long term (current) drug therapy: Secondary | ICD-10-CM | POA: Insufficient documentation

## 2024-03-07 DIAGNOSIS — I4891 Unspecified atrial fibrillation: Secondary | ICD-10-CM | POA: Insufficient documentation

## 2024-03-07 DIAGNOSIS — R109 Unspecified abdominal pain: Secondary | ICD-10-CM

## 2024-03-07 DIAGNOSIS — R110 Nausea: Secondary | ICD-10-CM | POA: Insufficient documentation

## 2024-03-07 DIAGNOSIS — I10 Essential (primary) hypertension: Secondary | ICD-10-CM | POA: Insufficient documentation

## 2024-03-07 LAB — URINALYSIS, ROUTINE W REFLEX MICROSCOPIC
Bacteria, UA: NONE SEEN
Bilirubin Urine: NEGATIVE
Glucose, UA: NEGATIVE mg/dL
Ketones, ur: NEGATIVE mg/dL
Leukocytes,Ua: NEGATIVE
Nitrite: NEGATIVE
Protein, ur: 30 mg/dL — AB
Specific Gravity, Urine: 1.004 — ABNORMAL LOW (ref 1.005–1.030)
pH: 6 (ref 5.0–8.0)

## 2024-03-07 LAB — CBC
HCT: 54.5 % — ABNORMAL HIGH (ref 39.0–52.0)
Hemoglobin: 17.8 g/dL — ABNORMAL HIGH (ref 13.0–17.0)
MCH: 27.5 pg (ref 26.0–34.0)
MCHC: 32.7 g/dL (ref 30.0–36.0)
MCV: 84.2 fL (ref 80.0–100.0)
Platelets: 180 K/uL (ref 150–400)
RBC: 6.47 MIL/uL — ABNORMAL HIGH (ref 4.22–5.81)
RDW: 15.3 % (ref 11.5–15.5)
WBC: 11.5 K/uL — ABNORMAL HIGH (ref 4.0–10.5)
nRBC: 0 % (ref 0.0–0.2)

## 2024-03-07 LAB — COMPREHENSIVE METABOLIC PANEL WITH GFR
ALT: 43 U/L (ref 0–44)
AST: 29 U/L (ref 15–41)
Albumin: 4 g/dL (ref 3.5–5.0)
Alkaline Phosphatase: 85 U/L (ref 38–126)
Anion gap: 13 (ref 5–15)
BUN: 11 mg/dL (ref 6–20)
CO2: 23 mmol/L (ref 22–32)
Calcium: 8.9 mg/dL (ref 8.9–10.3)
Chloride: 102 mmol/L (ref 98–111)
Creatinine, Ser: 0.85 mg/dL (ref 0.61–1.24)
GFR, Estimated: >60 mL/min (ref >60)
Glucose, Bld: 96 mg/dL (ref 70–99)
Potassium: 3.7 mmol/L (ref 3.5–5.1)
Sodium: 138 mmol/L (ref 135–145)
Total Bilirubin: 1.4 mg/dL — ABNORMAL HIGH (ref 0.0–1.2)
Total Protein: 7.5 g/dL (ref 6.5–8.1)

## 2024-03-07 LAB — LIPASE, BLOOD: Lipase: 41 U/L (ref 11–51)

## 2024-03-07 MED ORDER — SODIUM CHLORIDE 0.9 % IV BOLUS
1000.0000 mL | Freq: Once | INTRAVENOUS | Status: AC
  Administered 2024-03-07: 1000 mL via INTRAVENOUS

## 2024-03-07 MED ORDER — MORPHINE SULFATE (PF) 4 MG/ML IV SOLN
4.0000 mg | Freq: Once | INTRAVENOUS | Status: AC
  Administered 2024-03-07: 4 mg via INTRAVENOUS
  Filled 2024-03-07: qty 1

## 2024-03-07 MED ORDER — IOHEXOL 350 MG/ML SOLN
75.0000 mL | Freq: Once | INTRAVENOUS | Status: AC | PRN
  Administered 2024-03-07: 75 mL via INTRAVENOUS

## 2024-03-07 NOTE — ED Provider Notes (Signed)
 [facility] AT [facility] Provider Note   CSN: [id number] Arrival date & time: 03/07/24  2006     Patient presents with: Abdominal Pain   Ronald Maxwell is a 48 y.o. male.  Patient with medical history significant for atrial fibrillation presents to the emergency department complaining of lower abdominal pain which woke him this morning at 4:30 in the morning.  He also complains of some left-sided back pain which began later this afternoon.  He denies nausea, vomiting, urinary symptoms, chest pain, shortness of breath, fever.  Pain is described as sharp.  {Add pertinent medical, surgical, social history, OB history to HPI:32947}  Abdominal Pain      Prior to Admission medications   Medication Sig Start Date End Date Taking? Authorizing Provider  albuterol  (VENTOLIN  HFA) 108 (90 Base) MCG/ACT inhaler Inhale 1-2 puffs into the lungs every 6 (six) hours as needed for wheezing or shortness of breath. 05/28/23   D[place], [place]  N, FNP    Allergies: Patient has no known allergies.    Review of Systems  Gastrointestinal:  Positive for abdominal pain.    Updated Vital Signs BP (!) 204/133   Pulse 64   Temp 97.9 F (36.6 C)   Resp 19   Ht 5' 5 (1.651 m)   Wt 75 kg   SpO2 98%   BMI 27.51 kg/m   Physical Exam Vitals and nursing note reviewed.  Constitutional:      General: He is not in acute distress.    Appearance: He is well-developed.  HENT:     Head: Normocephalic and atraumatic.  Eyes:     Conjunctiva/sclera: Conjunctivae normal.  Cardiovascular:     Rate and Rhythm: Normal rate and regular rhythm.     Heart sounds: No murmur heard. Pulmonary:     Effort: Pulmonary effort is normal. No respiratory distress.     Breath sounds: Normal breath sounds.  Abdominal:     Palpations: Abdomen is soft.     Tenderness: There is abdominal tenderness in the left lower quadrant.  Musculoskeletal:        General: No swelling.     Cervical  back: Neck supple.  Skin:    General: Skin is warm and dry.     Capillary Refill: Capillary refill takes less than 2 seconds.  Neurological:     Mental Status: He is alert.  Psychiatric:        Mood and Affect: Mood normal.     (all labs ordered are listed, but only abnormal results are displayed) Labs Reviewed  COMPREHENSIVE METABOLIC PANEL WITH GFR - Abnormal; Notable for the following components:      Result Value   Total Bilirubin 1.4 (*)    All other components within normal limits  CBC - Abnormal; Notable for the following components:   WBC 11.5 (*)    RBC 6.47 (*)    Hemoglobin 17.8 (*)    HCT 54.5 (*)    All other components within normal limits  URINALYSIS, ROUTINE W REFLEX MICROSCOPIC - Abnormal; Notable for the following components:   Specific Gravity, Urine 1.004 (*)    Hgb urine dipstick MODERATE (*)    Protein, ur 30 (*)    All other components within normal limits  LIPASE, BLOOD    EKG: None  Radiology: No results found.  {Document cardiac monitor, telemetry assessment procedure when appropriate:32947} Procedures   Medications Ordered in the ED  morphine (PF) 4 MG/ML injection 4 mg (  4 mg Intravenous Given 03/07/24 2235)      {Click here for ABCD2, HEART and other calculators REFRESH Note before signing:1}                              Medical Decision Making Amount and/or Complexity of Data Reviewed Labs: ordered. Radiology: ordered.  Risk Prescription drug management.   This patient presents to the ED for concern of abdominal pain, this involves an extensive number of treatment options, and is a complaint that carries with it a high risk of complications and morbidity.  The differential diagnosis includes diverticulitis, nephrolithiasis, pyelonephritis, colitis, others   Co morbidities / Chronic conditions that complicate the patient evaluation  Atrial fibrillation   Additional history obtained:  Additional history obtained from  EMR   Lab Tests:  I Ordered, and personally interpreted labs.  The pertinent results include:  Mild leukocytosis with white count of 11,500; minimally elevated bili at 1.4; UA with moderate Hgb   Imaging Studies ordered:  I ordered imaging studies including ct chest abdomen pelvis dissection study I independently visualized and interpreted imaging which showed *** I agree with the radiologist interpretation   Cardiac Monitoring: / EKG:  The patient was maintained on a cardiac monitor.  I personally viewed and interpreted the cardiac monitored which showed an underlying rhythm of: ***   Problem List / ED Course / Critical interventions / Medication management  *** I ordered medication including ***   Reevaluation of the patient after these medicines showed that the patient *** I have reviewed the patients home medicines and have made adjustments as needed   Consultations Obtained:  I requested consultation with the ***,  and discussed lab and imaging findings as well as pertinent plan - they recommend: ***   Social Determinants of Health:  ***   Test / Admission - Considered:  ***   {Document critical care time when appropriate  Document review of labs and clinical decision tools ie CHADS2VASC2, etc  Document your independent review of radiology images and any outside records  Document your discussion with family members, caretakers and with consultants  Document social determinants of health affecting pt's care  Document your decision making why or why not admission, treatments were needed:32947:::1}   Final diagnoses:  None    ED Discharge Orders     None

## 2024-03-07 NOTE — ED Triage Notes (Signed)
 Abd and back pain since early this am  not vomiting  med he has for pain is not working

## 2024-03-07 NOTE — ED Triage Notes (Signed)
 Pt c/o general abd pain since 0430 this morning. Pt denies N/V/D.

## 2024-03-08 MED ORDER — ONDANSETRON HCL 4 MG PO TABS: 4.0000 mg | ORAL_TABLET | Freq: Four times a day (QID) | ORAL | 0 refills | Status: AC

## 2024-03-08 MED ORDER — MORPHINE SULFATE (PF) 2 MG/ML IV SOLN
2.0000 mg | Freq: Once | INTRAVENOUS | Status: AC
  Administered 2024-03-08: 2 mg via INTRAVENOUS
  Filled 2024-03-08: qty 1

## 2024-03-08 MED ORDER — ONDANSETRON HCL 4 MG/2ML IJ SOLN
4.0000 mg | Freq: Once | INTRAMUSCULAR | Status: AC
  Administered 2024-03-08: 4 mg via INTRAVENOUS
  Filled 2024-03-08: qty 2

## 2024-03-08 MED ORDER — VALSARTAN 80 MG PO TABS: 80.0000 mg | ORAL_TABLET | Freq: Every day | ORAL | 0 refills | Status: AC

## 2024-03-08 NOTE — Discharge Instructions (Addendum)
 Your workup this morning was reassuring.  Is important you follow-up with a primary care provider for further management of your chronic conditions.  These conditions include atrial fibrillation and high blood pressure.  Medication was prescribed for your blood pressure this evening.  Nausea medication was also prescribed.  Thank you for the opportunity to take care of you in our Emergency Department. You have been diagnosed with high blood pressure, also known as hypertension. This means that the force of blood against the walls of your blood vessels called is too strong. It also means that your heart has to work harder to move the blood. High blood pressure usually has no symptoms, but over time, it can cause serious health problems such as Heart attack and heart failure Stroke Kidney disease and failure Vision loss With the help from your healthcare provider and some important life style changes, you can manage your blood pressure and protect your health. Please read the instructions provided on hypertension, how to manage it and how to check your blood pressure. Additionally, use the blood pressure log provided to record your blood pressures. Take the blood pressure log with you to your primary care doctor so that they can adjust your blood pressure medications if needed. Please read the instructions on follow-up appointment. Return to the ER or Call 911 right away if you have any of these symptoms: Chest pain or shortness of breath Severe headache Weakness, tingling, or numbness of your face, arms, or legs (especially on 1 side of the body) Sudden change in vision Confusion, trouble speaking, or trouble understanding speech

## 2024-03-09 ENCOUNTER — Telehealth: Payer: Self-pay | Admitting: *Deleted

## 2024-03-09 NOTE — Progress Notes (Unsigned)
 Care Guide Pharmacy Note  03/09/2024 Name: Ronald Maxwell MRN: [id number] DOB: December 31, 1975  Referred By: Darra [place], PA-C Reason for referral: Call Attempt #1 and Complex Care Management (Outreach to schedule referral with pharmacist )   Ronald Maxwell is a 48 y.o. year old male who is a primary care patient of [place], [place], [place].  Ronald Maxwell was referred to the pharmacist for assistance related to: HTN  An unsuccessful telephone outreach was attempted today to contact the patient who was referred to the pharmacy team for assistance with medication management. Additional attempts will be made to contact the patient.  Ronald Maxwell, CMA [place]  [facility], [facility] Guide Direct Dial: [phone]  Fax: [phone] Website: [email or web address]

## 2024-03-11 NOTE — Progress Notes (Unsigned)
 Care Guide Pharmacy Note  03/11/2024 Name: Ronald Maxwell MRN: [id number] DOB: January 30, 1976  Referred By: Darra Hamilton, PA-C Reason for referral: Call Attempt #1 and Complex Care Management (Outreach to schedule referral with pharmacist )   Ronald Maxwell is a 48 y.o. year old male who is a primary care patient of [facility], [place], [place].  Ronald Maxwell was referred to the pharmacist for assistance related to: HTN  A second unsuccessful telephone outreach was attempted today to contact the patient who was referred to the pharmacy team for assistance with medication management. Additional attempts will be made to contact the patient.  Ronald Maxwell, CMA [place]  [facility], [facility] Guide Direct Dial: [phone]  Fax: [phone] Website: [email or web address]

## 2024-03-12 NOTE — Progress Notes (Signed)
 Care Guide Pharmacy Note  03/12/2024 Name: Ronald Maxwell MRN: [id number] DOB: 09/06/1975  Referred By: Darra [place], PA-C Reason for referral: Call Attempt #1 and Complex Care Management (Outreach to schedule referral with pharmacist )   Ronald Maxwell is a 48 y.o. year old male who is a primary care patient of [facility], [place], [place].  Ronald Maxwell was referred to the pharmacist for assistance related to: HTN  A third unsuccessful telephone outreach was attempted today to contact the patient who was referred to the pharmacy team for assistance with medication management. The Population Health team is pleased to engage with this patient at any time in the future upon receipt of referral and should he/she be interested in assistance from the Population Health team.  Ronald Maxwell, CMA [facility]  [facility], [facility] Guide Direct Dial: [phone]  Fax: [phone] Website: [email or web address]
